# Patient Record
Sex: Male | Born: 1965 | Race: White | Hispanic: No | Marital: Married | Smoking: Former smoker
Health system: Southern US, Community
[De-identification: ages and names within clinical notes are randomized; demographics above are authoritative.]

## PROBLEM LIST (undated history)

## (undated) HISTORY — PX: VASECTOMY: SHX75

---

## 2009-06-12 ENCOUNTER — Ambulatory Visit: Payer: Self-pay | Admitting: Family Medicine

## 2012-11-09 ENCOUNTER — Emergency Department: Payer: Self-pay | Admitting: Emergency Medicine

## 2012-11-09 LAB — CK TOTAL AND CKMB (NOT AT ARMC)
CK, Total: 168 U/L (ref 35–232)
CK-MB: 1.4 ng/mL (ref 0.5–3.6)

## 2012-11-09 LAB — CBC
HCT: 45.6 % (ref 40.0–52.0)
MCH: 30.7 pg (ref 26.0–34.0)
MCV: 89 fL (ref 80–100)
Platelet: 213 10*3/uL (ref 150–440)
RBC: 5.12 10*6/uL (ref 4.40–5.90)
RDW: 13.2 % (ref 11.5–14.5)
WBC: 12 10*3/uL — ABNORMAL HIGH (ref 3.8–10.6)

## 2012-11-09 LAB — BASIC METABOLIC PANEL
BUN: 13 mg/dL (ref 7–18)
Calcium, Total: 8.9 mg/dL (ref 8.5–10.1)
Chloride: 105 mmol/L (ref 98–107)
Creatinine: 1.27 mg/dL (ref 0.60–1.30)
Potassium: 3.7 mmol/L (ref 3.5–5.1)
Sodium: 136 mmol/L (ref 136–145)

## 2012-11-09 LAB — TROPONIN I: Troponin-I: <0.02 ng/mL

## 2012-11-10 LAB — DIFFERENTIAL
Basophil #: 0.1 10*3/uL (ref 0.0–0.1)
Basophil %: 0.5 %
Eosinophil #: 0 10*3/uL (ref 0.0–0.7)
Lymphocyte #: 0.9 10*3/uL — ABNORMAL LOW (ref 1.0–3.6)
Monocyte %: 11.2 %
Neutrophil #: 9.8 10*3/uL — ABNORMAL HIGH (ref 1.4–6.5)
Neutrophil %: 80.9 %

## 2014-10-01 IMAGING — US US EXTREM LOW VENOUS*L*
1 series · 14 of 24 positions shown · non-contrast
Comparison: none

REASON FOR EXAM: LLE PAIN, REDNESS, SWELLING; EVAL DVT VS ABSCESS
COMMENTS:   May transport without cardiac monitor

[Series 1: us extrem low venous*left* · 0.09mm/px · 14 of 38 slices shown]
[im 1/38]
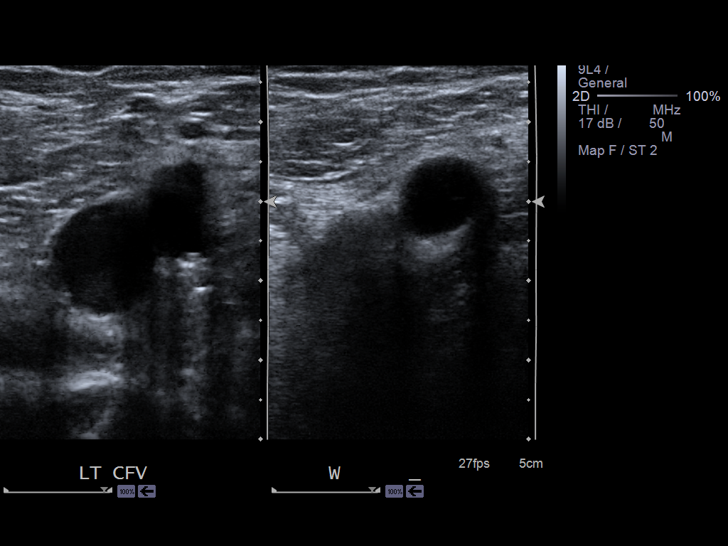
[im 4/38]
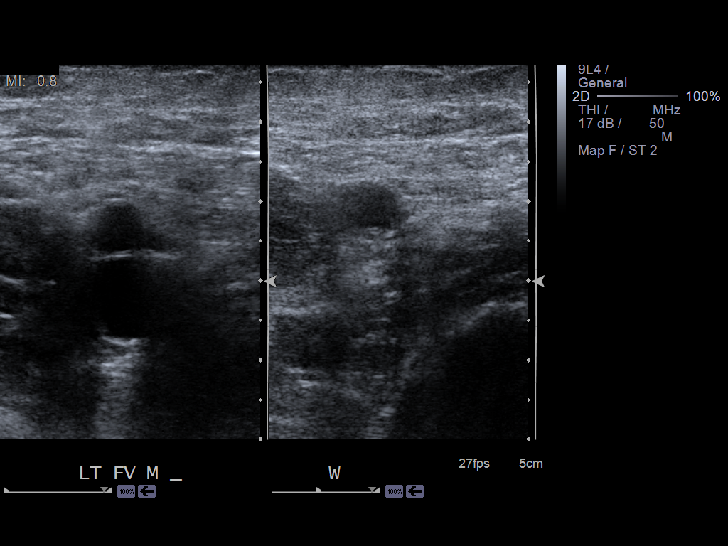
[im 7/38]
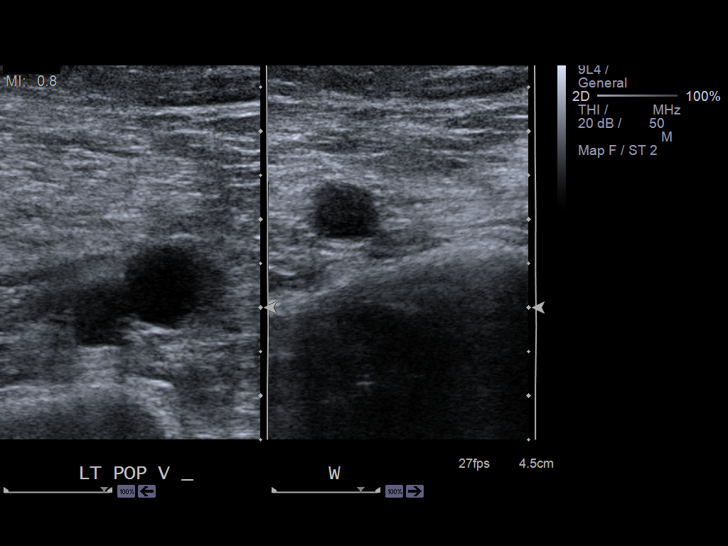
[im 10/38]
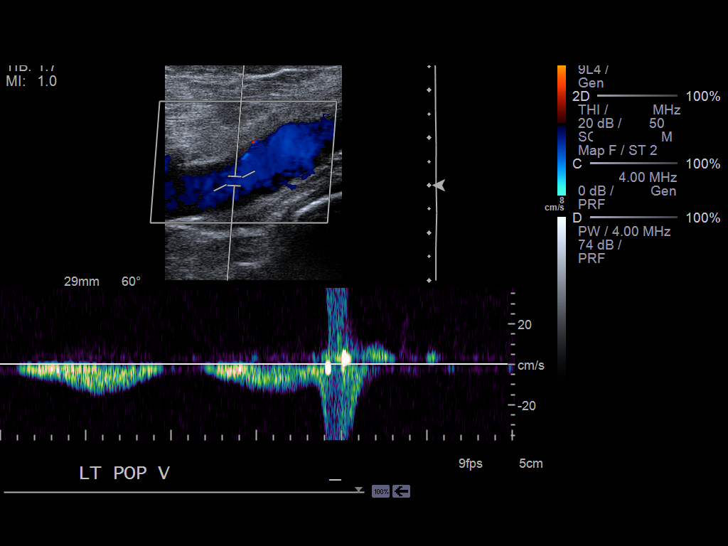
[im 12/38]
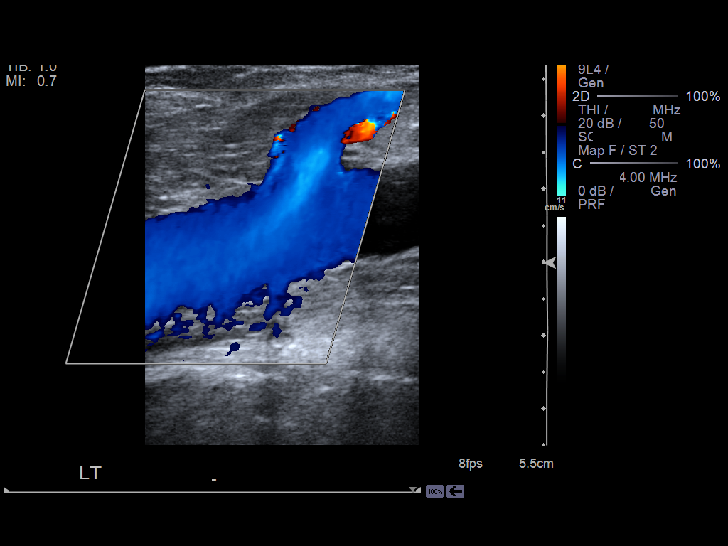
[im 15/38]
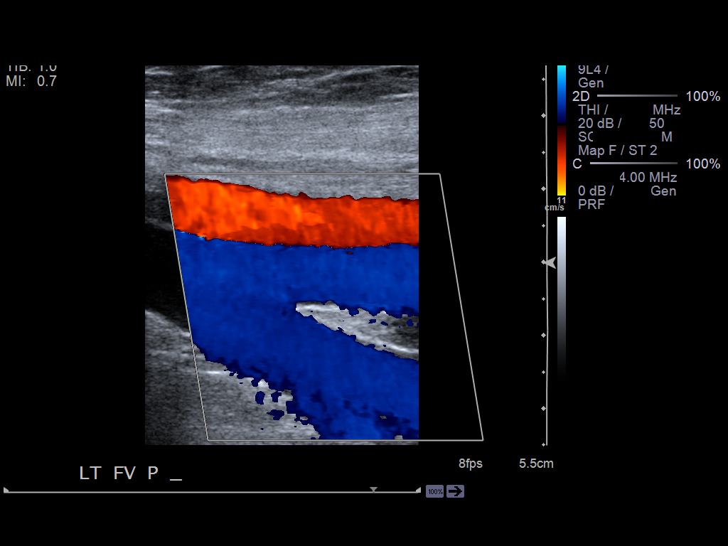
[im 18/38]
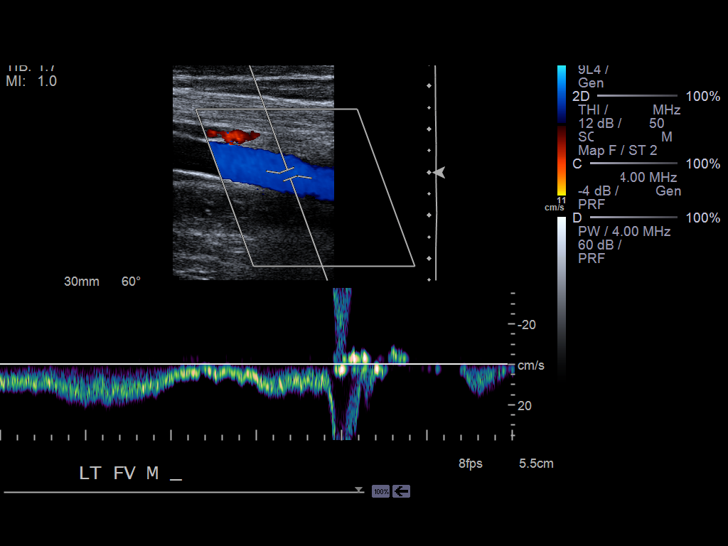
[im 20/38]
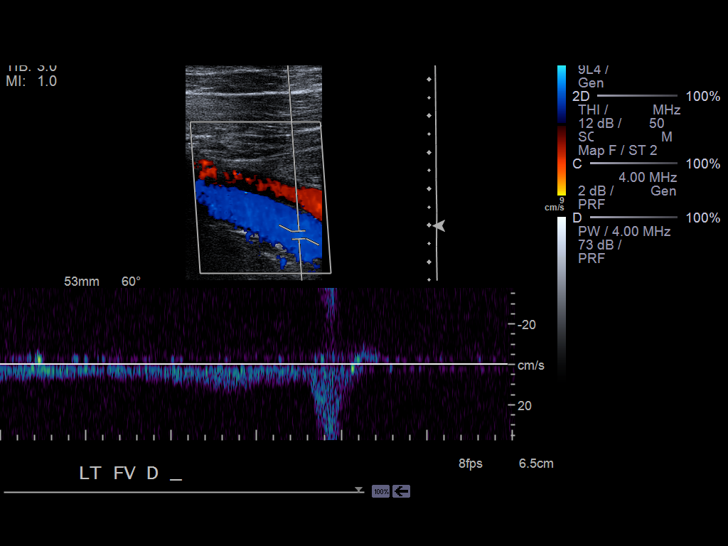
[im 23/38]
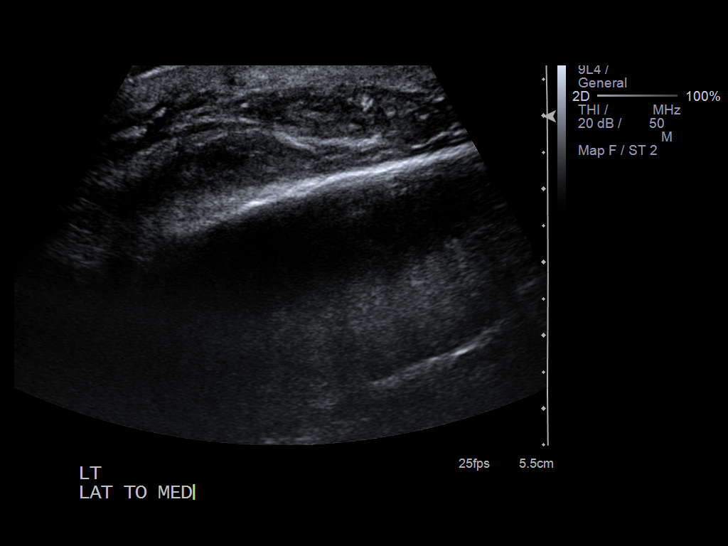
[im 26/38]
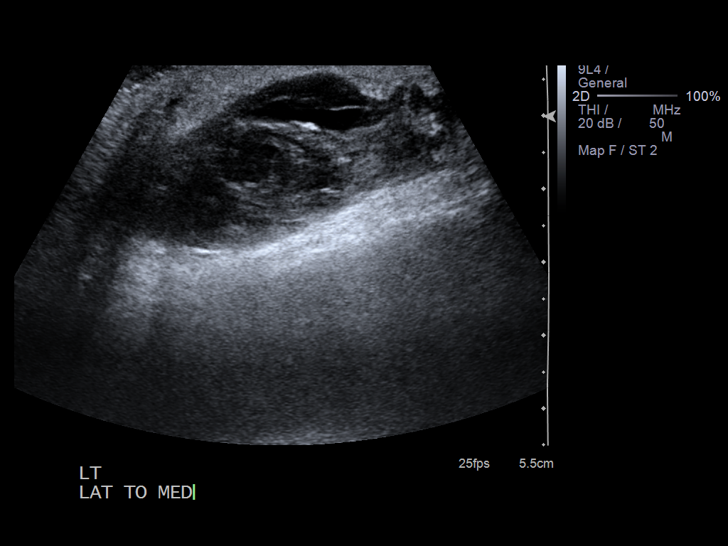
[im 29/38]
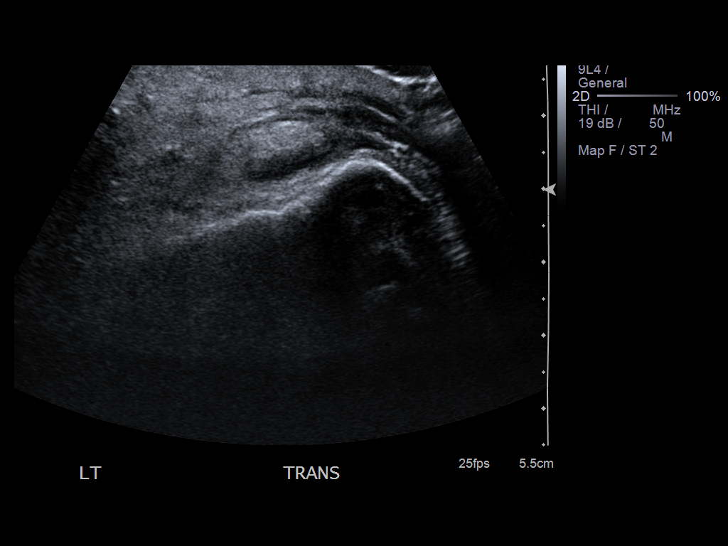
[im 31/38]
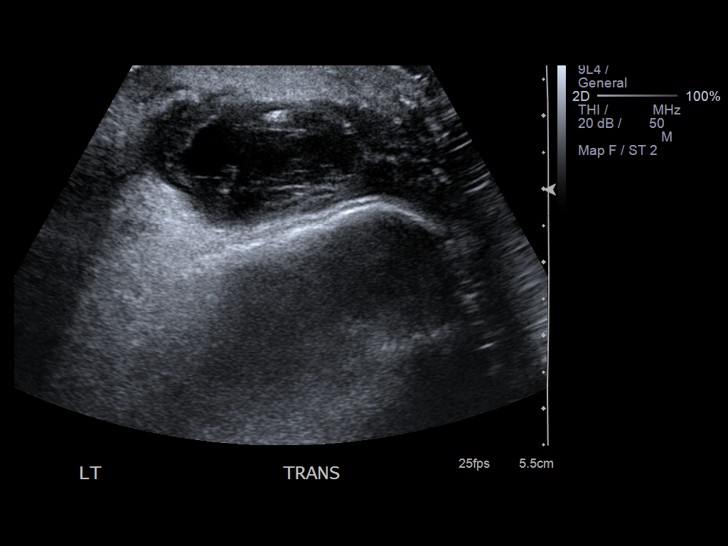
[im 34/38]
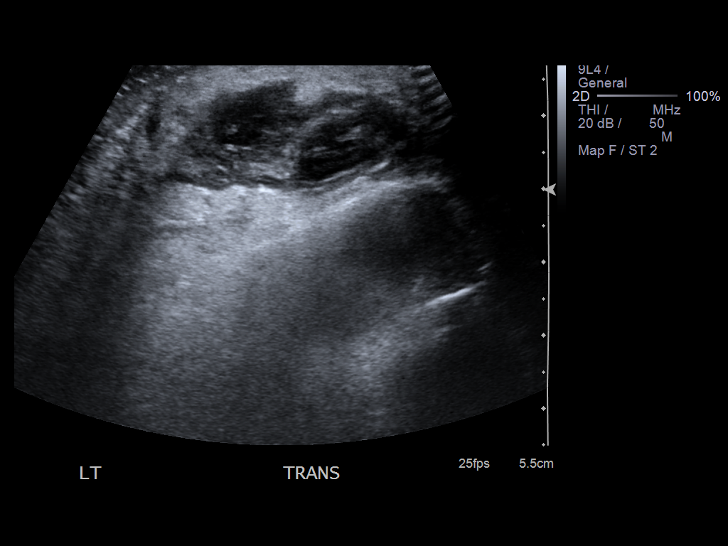
[im 38/38]
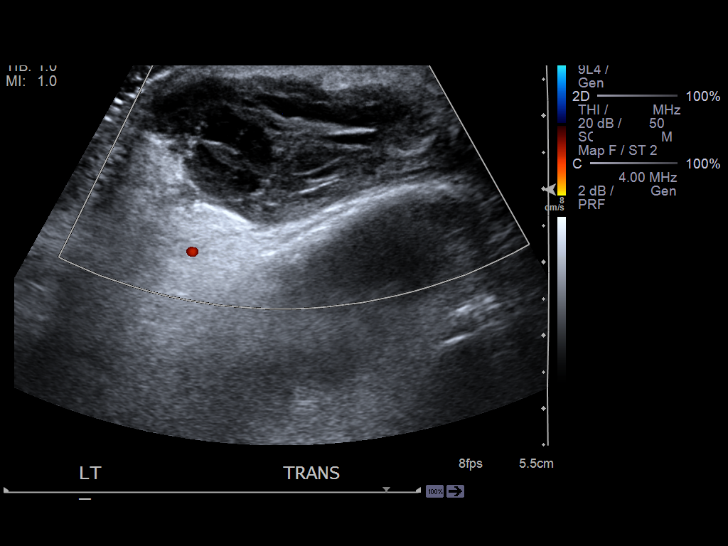

[14 of 24 positions shown; findings below may reference images not displayed]

PROCEDURE:     US  - US DOPPLER LOW EXTR LEFT  - November 10, 2012  [DATE]

RESULT:     Doppler interrogation of the deep venous system of the left leg
is performed from the common femoral vein into the popliteal vein. There is
complete compressibility. The color and spectral Doppler appearance is
normal a read

In the area of trauma to left shin there is a mixed echotexture collection
that is predominantly hypoechoic to anechoic with some bands of
isoechogenicity within it. This area measures up to 4.56 cm.
IMPRESSION: 1. No evidence of left lower extremity deep vein thrombosis.
2. At the site of trauma the predominant fluid collection could represent a
hematoma or abscess. Correlate clinically.

[REDACTED]

## 2015-06-19 ENCOUNTER — Ambulatory Visit
Admission: EM | Admit: 2015-06-19 | Discharge: 2015-06-19 | Disposition: A | Discharge status: Home / Self Care | Payer: BLUE CROSS/BLUE SHIELD | Attending: Family Medicine | Admitting: Family Medicine

## 2015-06-19 DIAGNOSIS — S0101XA Laceration without foreign body of scalp, initial encounter: Secondary | ICD-10-CM

## 2015-06-19 MED ORDER — MUPIROCIN 2 % EX OINT: 1.0000 "application " | TOPICAL_OINTMENT | Freq: Three times a day (TID) | CUTANEOUS | Status: AC

## 2015-06-19 MED ORDER — TETANUS-DIPHTH-ACELL PERTUSSIS 5-2.5-18.5 LF-MCG/0.5 IM SUSP
0.5000 mL | Freq: Once | INTRAMUSCULAR | Status: AC
  Administered 2015-06-19: 0.5 mL via INTRAMUSCULAR

## 2015-06-19 NOTE — ED Provider Notes (Signed)
CSN: [id number]     Arrival date & time 06/19/15  1353 History   First MD Initiated Contact with Patient 06/19/15 1456     Chief Complaint  Patient presents with  . Head Injury    pt hit in head with a wooden bookshelf as it fell over. Laceration noted with bleeding controlled. Pain 2/10   (Consider location/radiation/quality/duration/timing/severity/associated sxs/prior Treatment) HPI   This a 50 year old male who presents with a aspiration to the scalp overlying the right parietal area. He states that he was placing a bookcase into a truck that he thought was stable but as he turned the bookcase shifted and fell against him striking his head. He states he had no loss of consciousness has not been confused or dazed. He had some slight nausea. His presenting blood pressure was elevated and he stated that his chiropractor also noted his blood pressure was elevated but he is not interested in having medication at this time.  History reviewed. No pertinent past medical history. Past Surgical History  Procedure Laterality Date  . Vasectomy     History reviewed. No pertinent family history. Social History  Substance Use Topics  . Smoking status: Former Games developermoker  . Smokeless tobacco: None  . Alcohol Use: Yes     Comment: social    Review of Systems  Constitutional: Positive for activity change. Negative for fever, chills and fatigue.  Skin: Positive for wound.  Neurological: Positive for light-headedness. Negative for dizziness and headaches.    Allergies  Review of patient's allergies indicates no known allergies.  Home Medications   Prior to Admission medications   Medication Sig Start Date End Date Taking? Authorizing Provider  mupirocin ointment (BACTROBAN) 2 % Apply 1 application topically 3 (three) times daily. 06/19/15   Lutricia FeilWilliam P Lenice Koper, PA-C   Meds Ordered and Administered this Visit   Medications  Tdap (BOOSTRIX) injection 0.5 mL (0.5 mLs Intramuscular Given 06/19/15  1419)    BP 171/101 mmHg  Pulse 83  Temp(Src) 98.1 F (36.7 C) (Oral)  Resp 20  Ht 6\' 2"  (1.88 m)  Wt 213 lb (96.616 kg)  BMI 27.34 kg/m2  SpO2 98% No data found.   Physical Exam  Constitutional: He is oriented to person, place, and time. He appears well-developed and well-nourished. No distress.  HENT:  Head: Normocephalic and atraumatic.  Eyes: Conjunctivae are normal. Pupils are equal, round, and reactive to light.  Neck: Normal range of motion. Neck supple.  Musculoskeletal: Normal range of motion. He exhibits no edema or tenderness.  Neurological: He is alert and oriented to person, place, and time. No cranial nerve deficit.  Skin: He is not diaphoretic.  Condition of the right scalp shows a 2 half centimeter long aspiration over the parietal area with no palpable defect below. Leading has stopped.  Psychiatric: He has a normal mood and affect. His behavior is normal. Judgment and thought content normal.  Nursing note and vitals reviewed.   ED Course  .Marland Kitchen.Laceration Repair Date/Time: 06/19/2015 3:40 PM Performed by: Lutricia FeilOEMER, Kanika Bungert P Authorized by: Jolene ProvostPATEL, KIRTIDA Consent: Verbal consent obtained. Consent given by: patient Patient understanding: patient states understanding of the procedure being performed Patient identity confirmed: verbally with patient, provided demographic data, arm band and hospital-assigned identification number Body area: head/neck Location details: scalp Laceration length: 2.5 cm Foreign bodies: no foreign bodies Tendon involvement: none Nerve involvement: none Vascular damage: no Anesthesia: local infiltration Local anesthetic: lidocaine 1% with epinephrine Anesthetic total: 4 ml Patient sedated: no Preparation: Patient  was prepped and draped in the usual sterile fashion. Irrigation solution: saline Irrigation method: jet lavage Amount of cleaning: extensive Debridement: none Degree of undermining: none Skin closure: staples Number of  sutures: 5 Approximation: close Dressing: antibiotic ointment and 4x4 sterile gauze Patient tolerance: Patient tolerated the procedure well with no immediate complications Comments: We'll keep the wound dry for 24 hours. He is then them Mason start applying Bactroban to the wound edge 3 times a day. Signs and symptoms of infection were outlined with the patient and his wife. We'll plan on staple removal  in 7 days   (including critical care time)  Labs Review Labs Reviewed - No data to display  Imaging Review No results found.   Visual Acuity Review  Right Eye Distance:   Left Eye Distance:   Bilateral Distance:    Right Eye Near:   Left Eye Near:    Bilateral Near:     Medications  Tdap (BOOSTRIX) injection 0.5 mL (0.5 mLs Intramuscular Given 06/19/15 1419)      MDM   1. Laceration of scalp without complication, initial encounter    Discharge Medication List as of 06/19/2015  3:29 PM    START taking these medications   Details  mupirocin ointment (BACTROBAN) 2 % Apply 1 application topically 3 (three) times daily., Starting 06/19/2015, Until Discontinued, Normal       Plan: 1. Test/x-ray results and diagnosis reviewed with patient 2. rx as per orders; risks, benefits, potential side effects reviewed with patient 3. Recommend supportive treatment with Contrast for 24 hours. Then he may start washing program and applying Bactroban 3 times a day. Symptoms of infection were outlined to the patient and his wife. They return to clinic if any of these occur. I also talked to them ZO:XWRU injuries and given him print out of information regarding head injuries. We'll plan on taking the staples out in 7 days 4. F/u prn if symptoms worsen or don't improve     Lutricia Feil, PA-C 06/19/15 1547

## 2015-06-19 NOTE — Discharge Instructions (Signed)
Laceration Care, Adult °A laceration is a cut that goes through all of the layers of the skin and into the tissue that is right under the skin. Some lacerations heal on their own. Others need to be closed with stitches (sutures), staples, skin adhesive strips, or skin glue. Proper laceration care minimizes the risk of infection and helps the laceration to heal better. °HOW TO CARE FOR YOUR LACERATION °If sutures or staples were used: °· Keep the wound clean and dry. °· If you were given a bandage (dressing), you should change it at least one time per day or as told by your health care provider. You should also change it if it becomes wet or dirty. °· Keep the wound completely dry for the first 24 hours or as told by your health care provider. After that time, you may shower or bathe. However, make sure that the wound is not soaked in water until after the sutures or staples have been removed. °· Clean the wound one time each day or as told by your health care provider: °· Wash the wound with soap and water. °· Rinse the wound with water to remove all soap. °· Pat the wound dry with a clean towel. Do not rub the wound. °· After cleaning the wound, apply a thin layer of antibiotic ointment as told by your health care provider. This will help to prevent infection and keep the dressing from sticking to the wound. °· Have the sutures or staples removed as told by your health care provider. °If skin adhesive strips were used: °· Keep the wound clean and dry. °· If you were given a bandage (dressing), you should change it at least one time per day or as told by your health care provider. You should also change it if it becomes dirty or wet. °· Do not get the skin adhesive strips wet. You may shower or bathe, but be careful to keep the wound dry. °· If the wound gets wet, pat it dry with a clean towel. Do not rub the wound. °· Skin adhesive strips fall off on their own. You may trim the strips as the wound heals. Do not  remove skin adhesive strips that are still stuck to the wound. They will fall off in time. °If skin glue was used: °· Try to keep the wound dry, but you may briefly wet it in the shower or bath. Do not soak the wound in water, such as by swimming. °· After you have showered or bathed, gently pat the wound dry with a clean towel. Do not rub the wound. °· Do not do any activities that will make you sweat heavily until the skin glue has fallen off on its own. °· Do not apply liquid, cream, or ointment medicine to the wound while the skin glue is in place. Using those may loosen the film before the wound has healed. °· If you were given a bandage (dressing), you should change it at least one time per day or as told by your health care provider. You should also change it if it becomes dirty or wet. °· If a dressing is placed over the wound, be careful not to apply tape directly over the skin glue. Doing that may cause the glue to be pulled off before the wound has healed. °· Do not pick at the glue. The skin glue usually remains in place for 5-10 days, then it falls off of the skin. °General Instructions °· Take over-the-counter and prescription   medicines only as told by your health care provider. °· If you were prescribed an antibiotic medicine or ointment, take or apply it as told by your doctor. Do not stop using it even if your condition improves. °· To help prevent scarring, make sure to cover your wound with sunscreen whenever you are outside after stitches are removed, after adhesive strips are removed, or when glue remains in place and the wound is healed. Make sure to wear a sunscreen of at least 30 SPF. °· Do not scratch or pick at the wound. °· Keep all follow-up visits as told by your health care provider. This is important. °· Check your wound every day for signs of infection. Watch for: °· Redness, swelling, or pain. °· Fluid, blood, or pus. °· Raise (elevate) the injured area above the level of your heart  while you are sitting or lying down, if possible. °SEEK MEDICAL CARE IF: °· You received a tetanus shot and you have swelling, severe pain, redness, or bleeding at the injection site. °· You have a fever. °· A wound that was closed breaks open. °· You notice a bad smell coming from your wound or your dressing. °· You notice something coming out of the wound, such as wood or glass. °· Your pain is not controlled with medicine. °· You have increased redness, swelling, or pain at the site of your wound. °· You have fluid, blood, or pus coming from your wound. °· You notice a change in the color of your skin near your wound. °· You need to change the dressing frequently due to fluid, blood, or pus draining from the wound. °· You develop a new rash. °· You develop numbness around the wound. °SEEK IMMEDIATE MEDICAL CARE IF: °· You develop severe swelling around the wound. °· Your pain suddenly increases and is severe. °· You develop painful lumps near the wound or on skin that is anywhere on your body. °· You have a red streak going away from your wound. °· The wound is on your hand or foot and you cannot properly move a finger or toe. °· The wound is on your hand or foot and you notice that your fingers or toes look pale or bluish. °  °This information is not intended to replace advice given to you by your health care provider. Make sure you discuss any questions you have with your health care provider. °  °Document Released: 02/20/2005 Document Revised: 07/07/2014 Document Reviewed: 02/16/2014 °Elsevier Interactive Patient Education ©2016 [employer] ° °Stitches, Staples, or Adhesive Wound Closure °Health care providers use stitches (sutures), staples, and certain glue (skin adhesives) to hold skin together while it heals (wound closure). You may need this treatment after you have surgery or if you cut your skin accidentally. These methods help your skin to heal more quickly and make it less likely that you will have  a scar. A wound may take several months to heal completely. °The type of wound you have determines when your wound gets closed. In most cases, the wound is closed as soon as possible (primary skin closure). Sometimes, closure is delayed so the wound can be cleaned and allowed to heal naturally. This reduces the chance of infection. Delayed closure may be needed if your wound: °· Is caused by a bite. °· Happened more than 6 hours ago. °· Involves loss of skin or the tissues under the skin. °· Has dirt or debris in it that cannot be removed. °· Is infected. °WHAT   ARE THE DIFFERENT KINDS OF WOUND CLOSURES? °There are many options for wound closure. The one that your health care provider uses depends on how deep and how large your wound is. °Adhesive Glue °To use this type of glue to close a wound, your health care provider holds the edges of the wound together and paints the glue on the surface of your skin. You may need more than one layer of glue. Then the wound may be covered with a light bandage (dressing). °This type of skin closure may be used for small wounds that are not deep (superficial). Using glue for wound closure is less painful than other methods. It does not require a medicine that numbs the area (local anesthetic). This method also leaves nothing to be removed. Adhesive glue is often used for children and on facial wounds. °Adhesive glue cannot be used for wounds that are deep, uneven, or bleeding. It is not used inside of a wound.  °Adhesive Strips °These strips are made of sticky (adhesive), porous paper. They are applied across your skin edges like a regular adhesive bandage. You leave them on until they fall off. °Adhesive strips may be used to close very superficial wounds. They may also be used along with sutures to improve the closure of your skin edges.  °Sutures °Sutures are the oldest method of wound closure. Sutures can be made from natural substances, such as silk, or from synthetic  materials, such as nylon and steel. They can be made from a material that your body can break down as your wound heals (absorbable), or they can be made from a material that needs to be removed from your skin (nonabsorbable). They come in many different strengths and sizes. °Your health care provider attaches the sutures to a steel needle on one end. Sutures can be passed through your skin, or through the tissues beneath your skin. Then they are tied and cut. Your skin edges may be closed in one continuous stitch or in separate stitches. °Sutures are strong and can be used for all kinds of wounds. Absorbable sutures may be used to close tissues under the skin. The disadvantage of sutures is that they may cause skin reactions that lead to infection. Nonabsorbable sutures need to be removed. °Staples °When surgical staples are used to close a wound, the edges of your skin on both sides of the wound are brought close together. A staple is placed across the wound, and an instrument secures the edges together. Staples are often used to close surgical cuts (incisions). °Staples are faster to use than sutures, and they cause less skin reaction. Staples need to be removed using a tool that bends the staples away from your skin. °HOW DO I CARE FOR MY WOUND CLOSURE? °· Take medicines only as directed by your health care provider. °· If you were prescribed an antibiotic medicine for your wound, finish it all even if you start to feel better. °· Use ointments or creams only as directed by your health care provider. °· Wash your hands with soap and water before and after touching your wound. °· Do not soak your wound in water. Do not take baths, swim, or use a hot tub until your health care provider approves. °· Ask your health care provider when you can start showering. Cover your wound if directed by your health care provider. °· Do not take out your own sutures or staples. °· Do not pick at your wound. Picking can cause an  infection. °·   Keep all follow-up visits as directed by your health care provider. This is important. HOW LONG WILL I HAVE MY WOUND CLOSURE?  Leave adhesive glue on your skin until the glue peels away.  Leave adhesive strips on your skin until the strips fall off.  Absorbable sutures will dissolve within several days.  Nonabsorbable sutures and staples must be removed. The location of the wound will determine how long they stay in. This can range from several days to a couple of weeks. WHEN SHOULD I SEEK HELP FOR MY WOUND CLOSURE? Contact your health care provider if:  You have a fever.  You have chills.  You have drainage, redness, swelling, or pain at your wound.  There is a bad smell coming from your wound.  The skin edges of your wound start to separate after your sutures have been removed.  Your wound becomes thick, raised, and darker in color after your sutures come out (scarring).   This information is not intended to replace advice given to you by your health care provider. Make sure you discuss any questions you have with your health care provider.   Document Released: 11/15/2000 Document Revised: 03/13/2014 Document Reviewed: 07/30/2013 Elsevier Interactive Patient Education 2016 [employer]  Head Injury, Adult You have a head injury. Headaches and throwing up (vomiting) are common after a head injury. It should be easy to wake up from sleeping. Sometimes you must stay in the hospital. Most problems happen within the first 24 hours. Side effects may occur up to 7-10 days after the injury.  WHAT ARE THE TYPES OF HEAD INJURIES? Head injuries can be as minor as a bump. Some head injuries can be more severe. More severe head injuries include:  A jarring injury to the brain (concussion).  A bruise of the brain (contusion). This mean there is bleeding in the brain that can cause swelling.  A cracked skull (skull fracture).  Bleeding in the brain that collects, clots,  and forms a bump (hematoma). WHEN SHOULD I GET HELP RIGHT AWAY?   You are confused or sleepy.  You cannot be woken up.  You feel sick to your stomach (nauseous) or keep throwing up (vomiting).  Your dizziness or unsteadiness is getting worse.  You have very bad, lasting headaches that are not helped by medicine. Take medicines only as told by your doctor.  You cannot use your arms or legs like normal.  You cannot walk.  You notice changes in the black spots in the center of the colored part of your eye (pupil).  You have clear or bloody fluid coming from your nose or ears.  You have trouble seeing. During the next 24 hours after the injury, you must stay with someone who can watch you. This person should get help right away (call 911 in the [place] if you start to shake and are not able to control it (have seizures), you pass out, or you are unable to wake up. HOW CAN I PREVENT A HEAD INJURY IN THE FUTURE?  Wear seat belts.  Wear a helmet while bike riding and playing sports like football.  Stay away from dangerous activities around the house. WHEN CAN I RETURN TO NORMAL ACTIVITIES AND ATHLETICS? See your doctor before doing these activities. You should not do normal activities or play contact sports until 1 week after the following symptoms have stopped:  Headache that does not go away.  Dizziness.  Poor attention.  Confusion.  Memory problems.  Sickness to your stomach  or throwing up.  Tiredness.  Fussiness.  Bothered by bright lights or loud noises.  Anxiousness or depression.  Restless sleep. MAKE SURE YOU:   Understand these instructions.  Will watch your condition.  Will get help right away if you are not doing well or get worse.   This information is not intended to replace advice given to you by your health care provider. Make sure you discuss any questions you have with your health care provider.   Document Released: 02/03/2008 Document Revised:  03/13/2014 Document Reviewed: 10/28/2012 Elsevier Interactive Patient Education [employer]

## 2015-06-25 ENCOUNTER — Ambulatory Visit
Admission: EM | Admit: 2015-06-25 | Discharge: 2015-06-25 | Disposition: A | Discharge status: Home / Self Care | Payer: BLUE CROSS/BLUE SHIELD

## 2015-06-25 ENCOUNTER — Encounter: Payer: Self-pay | Admitting: Emergency Medicine

## 2015-06-25 NOTE — ED Notes (Signed)
Five staples removed from right side scalp. Incision well healed.

## 2022-09-15 DIAGNOSIS — H25013 Cortical age-related cataract, bilateral: Secondary | ICD-10-CM | POA: Diagnosis not present

## 2022-09-18 DIAGNOSIS — H5213 Myopia, bilateral: Secondary | ICD-10-CM | POA: Diagnosis not present
